# Patient Record
Sex: Male | Born: 1970 | Race: White | Hispanic: No | Marital: Married | Smoking: Never smoker
Health system: Southern US, Community
[De-identification: ages and names within clinical notes are randomized; demographics above are authoritative.]

## PROBLEM LIST (undated history)

## (undated) DIAGNOSIS — E785 Hyperlipidemia, unspecified: Secondary | ICD-10-CM

## (undated) HISTORY — PX: VASECTOMY: SHX75

## (undated) HISTORY — PX: WISDOM TOOTH EXTRACTION: SHX21

## (undated) HISTORY — DX: Hyperlipidemia, unspecified: E78.5

---

## 2003-07-27 ENCOUNTER — Ambulatory Visit (HOSPITAL_COMMUNITY): Admission: RE | Admit: 2003-07-27 | Discharge: 2003-07-27 | Payer: Self-pay | Admitting: Diagnostic Radiology

## 2008-03-11 ENCOUNTER — Ambulatory Visit (HOSPITAL_BASED_OUTPATIENT_CLINIC_OR_DEPARTMENT_OTHER): Admission: RE | Admit: 2008-03-11 | Discharge: 2008-03-11 | Payer: Self-pay | Admitting: Orthopedic Surgery

## 2014-03-22 ENCOUNTER — Other Ambulatory Visit (HOSPITAL_COMMUNITY): Payer: Self-pay | Admitting: Unknown Physician Specialty

## 2014-03-22 DIAGNOSIS — G459 Transient cerebral ischemic attack, unspecified: Secondary | ICD-10-CM

## 2016-05-28 DIAGNOSIS — E785 Hyperlipidemia, unspecified: Secondary | ICD-10-CM | POA: Diagnosis not present

## 2016-08-21 DIAGNOSIS — D3131 Benign neoplasm of right choroid: Secondary | ICD-10-CM | POA: Diagnosis not present

## 2017-03-21 DIAGNOSIS — R220 Localized swelling, mass and lump, head: Secondary | ICD-10-CM | POA: Diagnosis not present

## 2017-05-15 ENCOUNTER — Other Ambulatory Visit: Payer: Self-pay | Admitting: Internal Medicine

## 2017-05-15 DIAGNOSIS — E785 Hyperlipidemia, unspecified: Secondary | ICD-10-CM

## 2017-06-04 ENCOUNTER — Ambulatory Visit
Admission: RE | Admit: 2017-06-04 | Discharge: 2017-06-04 | Disposition: A | Discharge status: Home / Self Care | Payer: No Typology Code available for payment source | Source: Ambulatory Visit | Attending: Internal Medicine | Admitting: Internal Medicine

## 2017-06-04 DIAGNOSIS — E785 Hyperlipidemia, unspecified: Secondary | ICD-10-CM

## 2017-07-23 DIAGNOSIS — J3089 Other allergic rhinitis: Secondary | ICD-10-CM | POA: Diagnosis not present

## 2017-07-23 DIAGNOSIS — Z91010 Allergy to peanuts: Secondary | ICD-10-CM | POA: Diagnosis not present

## 2017-07-23 DIAGNOSIS — Z91013 Allergy to seafood: Secondary | ICD-10-CM | POA: Diagnosis not present

## 2017-07-23 DIAGNOSIS — T781XXA Other adverse food reactions, not elsewhere classified, initial encounter: Secondary | ICD-10-CM | POA: Diagnosis not present

## 2017-10-15 DIAGNOSIS — D3131 Benign neoplasm of right choroid: Secondary | ICD-10-CM | POA: Diagnosis not present

## 2017-12-26 DIAGNOSIS — Z23 Encounter for immunization: Secondary | ICD-10-CM | POA: Diagnosis not present

## 2018-12-24 DIAGNOSIS — Z23 Encounter for immunization: Secondary | ICD-10-CM | POA: Diagnosis not present

## 2019-02-17 DIAGNOSIS — Z20828 Contact with and (suspected) exposure to other viral communicable diseases: Secondary | ICD-10-CM | POA: Diagnosis not present

## 2019-07-16 DIAGNOSIS — E7849 Other hyperlipidemia: Secondary | ICD-10-CM | POA: Diagnosis not present

## 2019-07-16 DIAGNOSIS — Z0000 Encounter for general adult medical examination without abnormal findings: Secondary | ICD-10-CM | POA: Diagnosis not present

## 2019-07-16 DIAGNOSIS — Z125 Encounter for screening for malignant neoplasm of prostate: Secondary | ICD-10-CM | POA: Diagnosis not present

## 2019-11-10 IMAGING — CT CT HEART SCORING
3 series · 13 of 20 positions shown, 15 images · non-contrast
Comparison: None.

CLINICAL DATA: 46-year-old male with history of elevated
cholesterol.

EXAM:
CT HEART FOR CALCIUM SCORING
TECHNIQUE: CT heart was performed on a 64 channel system using prospective ECG
gating.
A non-contrast exam for calcium scoring was performed.
Note that this exam targets the heart and the chest was not imaged
in its entirety.

[Series 2: calcium scoring 2.00 qr36 bestdiast 70% · axial · 0.35mm/px · z∈[+1565,+1637]mm · 3 of 90 slices shown]
[im 18/90  vessel]
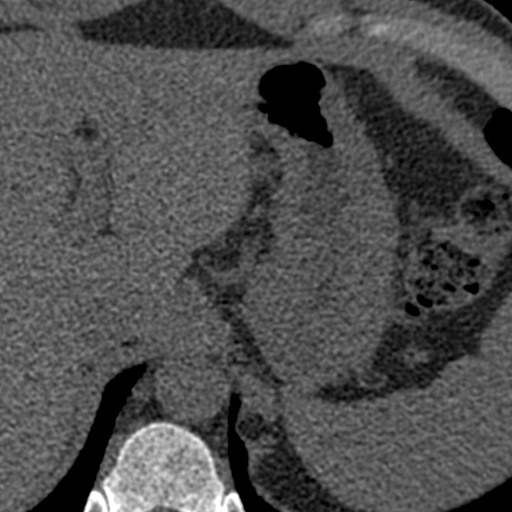
[im 36/90  vessel]
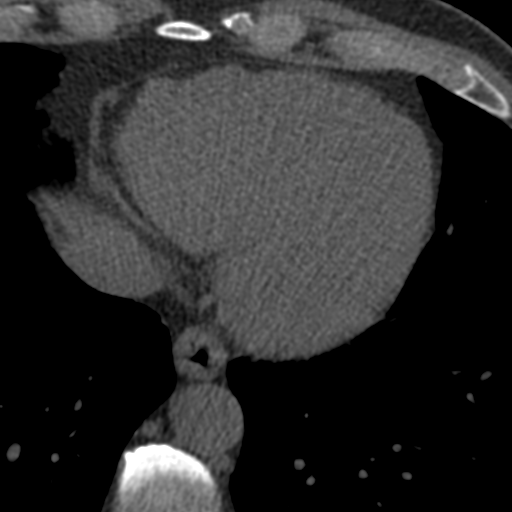
[im 54/90  vessel]
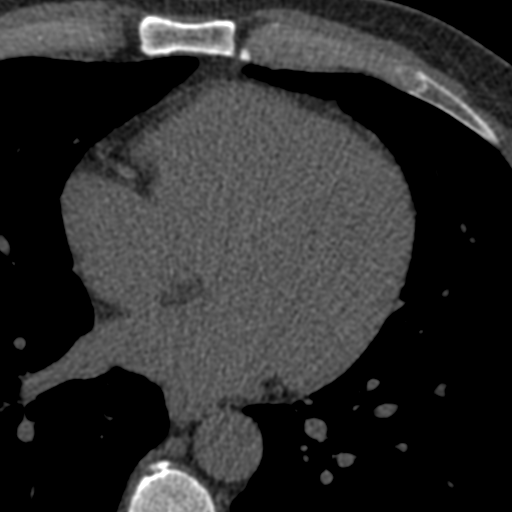

[Series 3: calcium scoring 2.00 br40 bestdiast 70% ax fov · axial · 0.52mm/px · z∈[+1559,+1679]mm · 5 of 90 slices shown, 7 images]
[im 15/90  vessel]
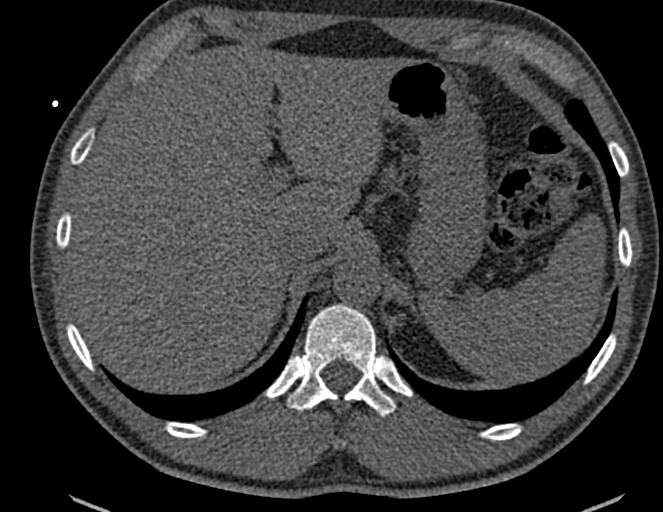
[im 15/90  lung]
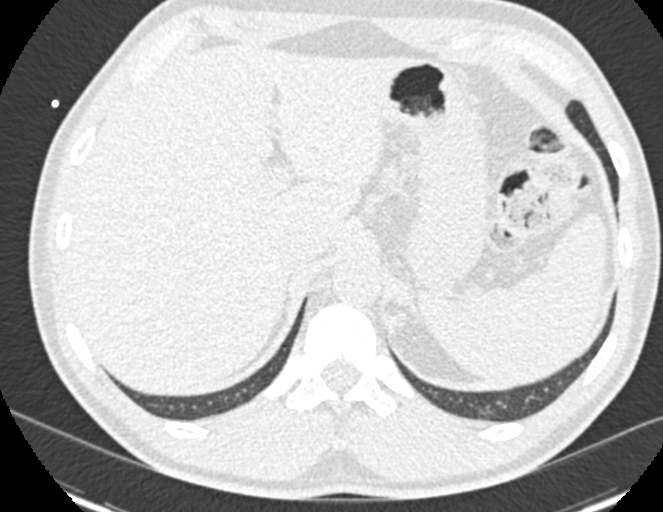
[im 30/90  vessel]
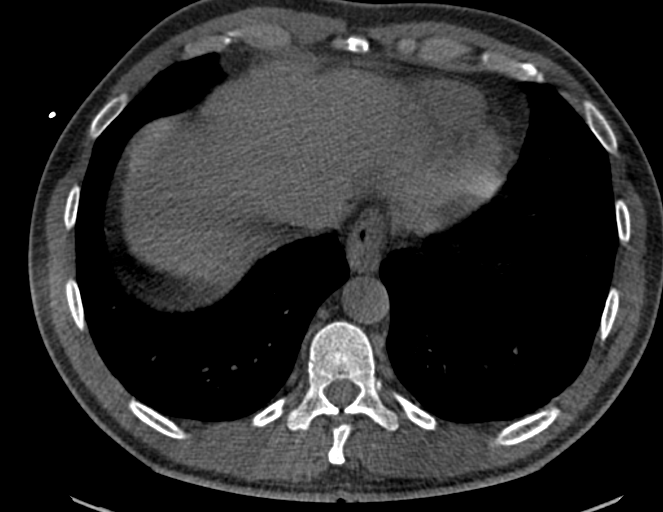
[im 45/90  vessel]
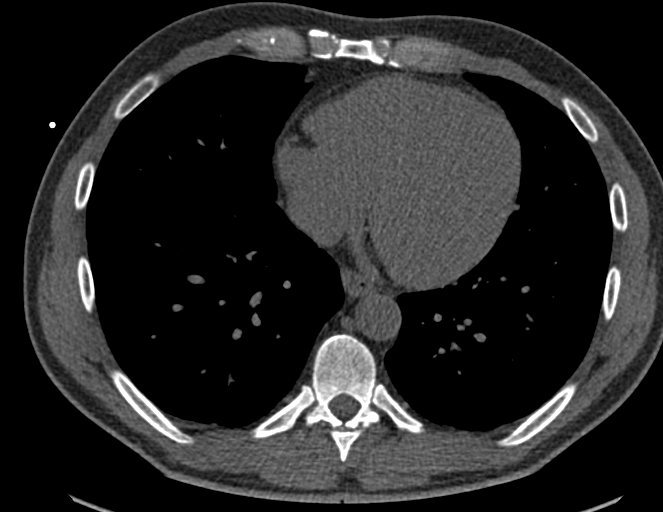
[im 60/90  vessel]
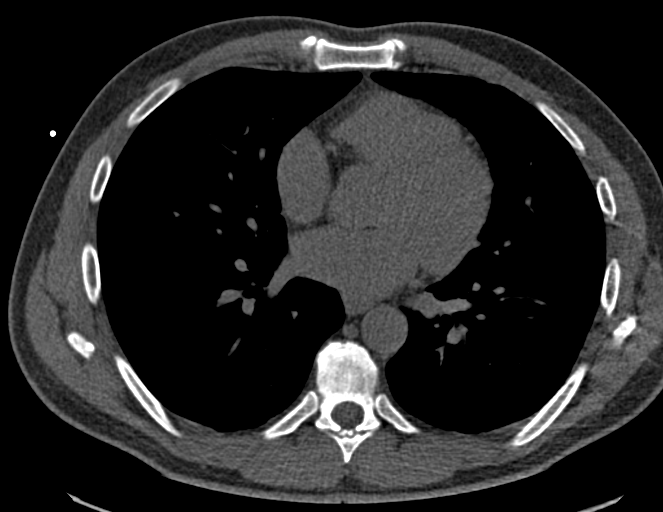
[im 75/90  vessel]
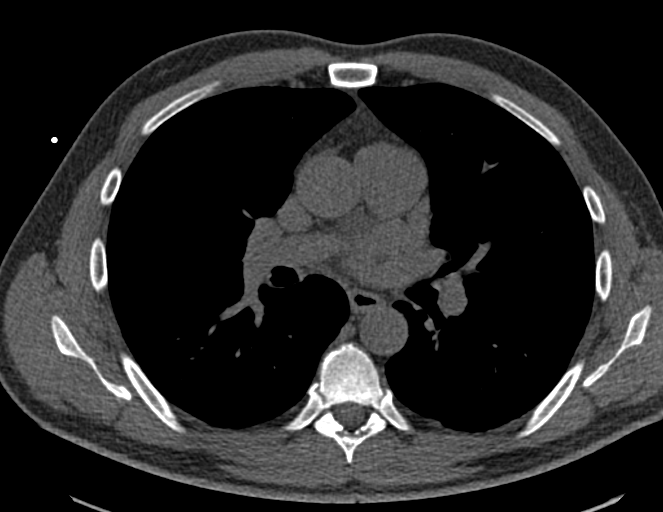
[im 75/90  lung]
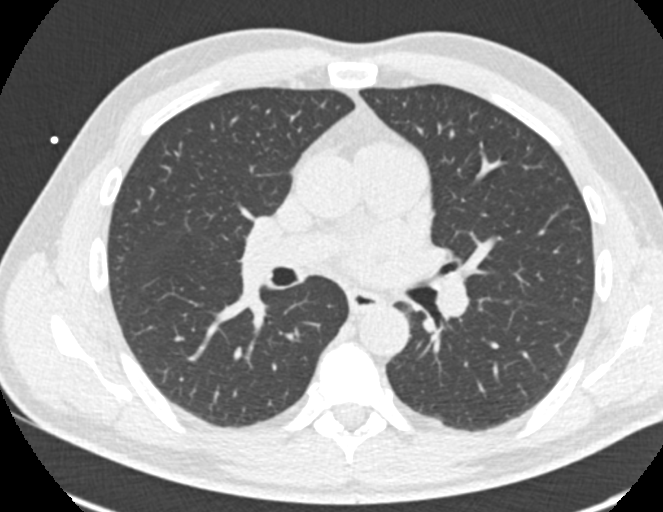

[Series 9: calcium scoring 2.00 br60 bestdiast 70% ax fov · axial · 0.52mm/px · z∈[+1559,+1679]mm · 5 of 90 slices shown]
[im 15/90  vessel]
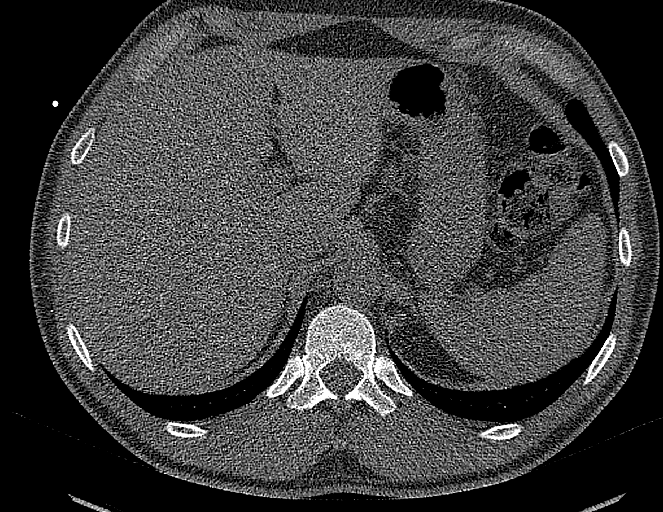
[im 30/90  vessel]
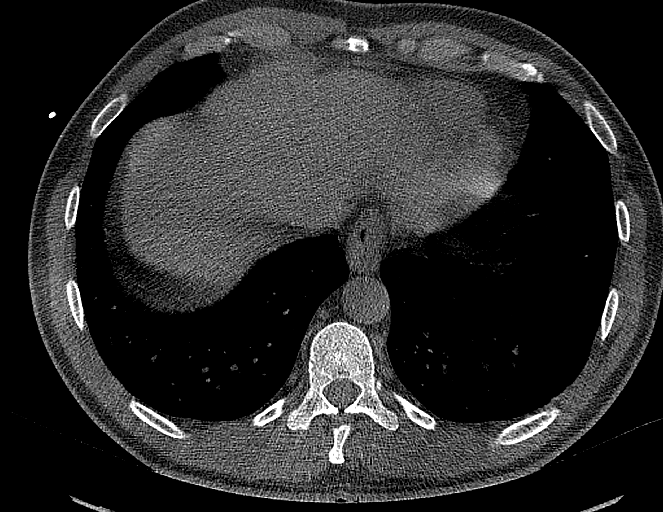
[im 45/90  vessel]
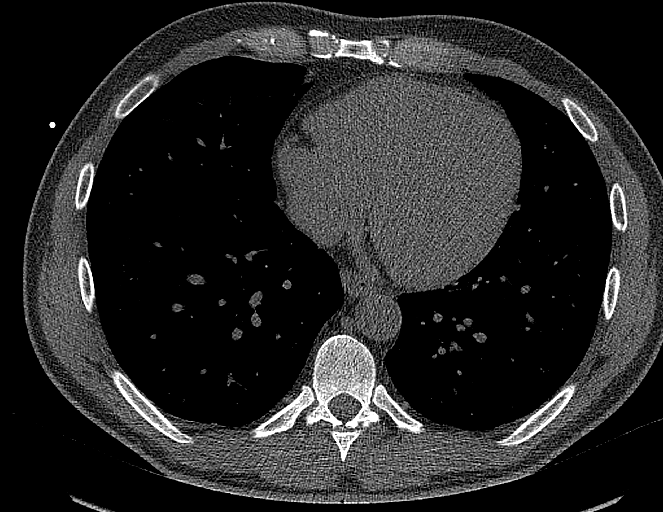
[im 60/90  vessel]
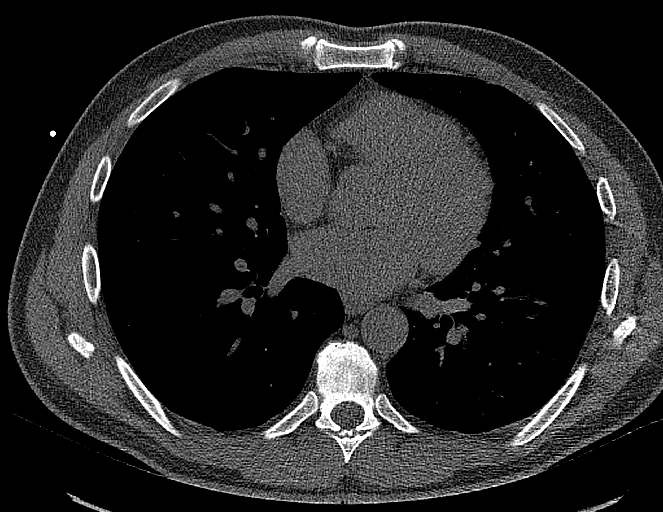
[im 75/90  vessel]
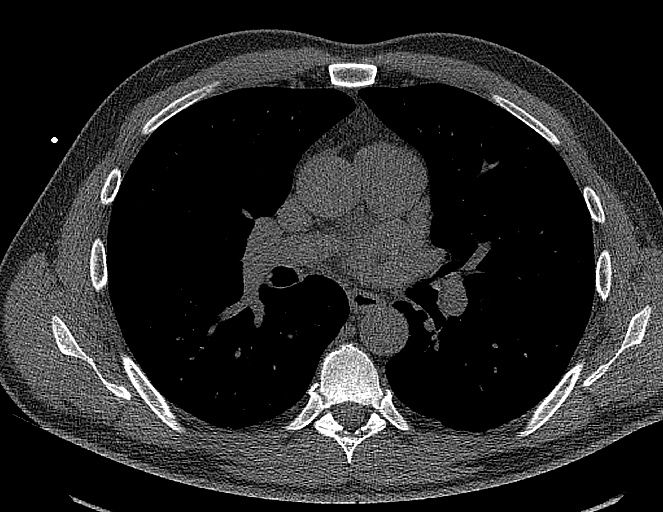

[13 of 20 positions shown; findings below may reference images not displayed]

FINDINGS: Technical quality: Good.

CORONARY CALCIUM

Total Agatston Score: 20

[HOSPITAL] percentile:  84th

OTHER FINDINGS:

Aortic atherosclerosis (mild). 4 mm subpleural nodule in the right
middle lobe associated with the minor fissure (axial image 22 of
series 9), nonspecific, but statistically likely to represent a
benign subpleural lymph node. Within the visualized portions of the
thorax there are no other larger more suspicious appearing pulmonary
nodules or masses, there is no acute consolidative airspace disease,
no pleural effusions, no pneumothorax and no lymphadenopathy.
Visualized portions of the upper abdomen are unremarkable. There are
no aggressive appearing lytic or blastic lesions noted in the
visualized portions of the skeleton.
IMPRESSION: 1. Patient's total coronary artery calcium score is 20 which is
eighty-fourth percentile for patient's of matched age, gender and
race/ethnicity. Please note that although the presence of coronary
artery calcium documents the presence of coronary artery disease,
the severity of this disease and any potential stenosis cannot be
assessed on this noncontrast CT examination. Assessment for
potential risk factor modification, dietary therapy or pharmacologic
therapy may be warranted, if clinically indicated.
2. 4 mm subpleural nodule in the right middle lobe. This is
nonspecific, but statistically likely benign. No follow-up needed if
patient is low-risk. Non-contrast chest CT can be considered in 12
months if patient is high-risk. This recommendation follows the
consensus statement: Guidelines for Management of Incidental
Pulmonary Nodules Detected on CT Images: From the [HOSPITAL]
3.  Aortic Atherosclerosis ([id number]-QHY.Y).

## 2020-11-14 ENCOUNTER — Telehealth: Payer: Self-pay

## 2020-11-14 NOTE — Telephone Encounter (Signed)
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 Pre-visit on 01/24/21 at 11:00am. I confirmed address and we will mail him appointment information and paperwork needed.

## 2020-11-15 ENCOUNTER — Encounter: Payer: Self-pay | Admitting: Internal Medicine

## 2020-11-15 NOTE — Telephone Encounter (Signed)
Paperwork was mailed out today with appointment details.

## 2020-12-13 DIAGNOSIS — Z23 Encounter for immunization: Secondary | ICD-10-CM | POA: Diagnosis not present

## 2021-01-24 ENCOUNTER — Other Ambulatory Visit: Payer: Self-pay

## 2021-01-24 ENCOUNTER — Ambulatory Visit (AMBULATORY_SURGERY_CENTER): Payer: Self-pay | Admitting: *Deleted

## 2021-01-24 ENCOUNTER — Encounter: Payer: Self-pay | Admitting: Internal Medicine

## 2021-01-24 VITALS — Ht 65.0 in | Wt 172.0 lb

## 2021-01-24 DIAGNOSIS — Z1211 Encounter for screening for malignant neoplasm of colon: Secondary | ICD-10-CM

## 2021-01-24 NOTE — Progress Notes (Signed)

## 2021-02-09 ENCOUNTER — Ambulatory Visit (AMBULATORY_SURGERY_CENTER): Payer: BC Managed Care – PPO | Admitting: Internal Medicine

## 2021-02-09 ENCOUNTER — Encounter: Payer: Self-pay | Admitting: Internal Medicine

## 2021-02-09 ENCOUNTER — Other Ambulatory Visit: Payer: Self-pay

## 2021-02-09 VITALS — BP 112/67 | HR 56 | Temp 97.8°F | Resp 11 | Ht 65.0 in | Wt 172.0 lb

## 2021-02-09 DIAGNOSIS — K5140 Inflammatory polyps of colon without complications: Secondary | ICD-10-CM | POA: Diagnosis not present

## 2021-02-09 DIAGNOSIS — Z1211 Encounter for screening for malignant neoplasm of colon: Secondary | ICD-10-CM

## 2021-02-09 DIAGNOSIS — D120 Benign neoplasm of cecum: Secondary | ICD-10-CM | POA: Diagnosis not present

## 2021-02-09 MED ORDER — SODIUM CHLORIDE 0.9 % IV SOLN: 500.0000 mL | Freq: Once | INTRAVENOUS | Status: DC

## 2021-02-09 NOTE — Patient Instructions (Addendum)
I found and removed one 2 mm polyp.  All else normal.  I will let you know pathology results and when to have another routine colonoscopy by mail and/or My Chart.  I appreciate the opportunity to care for you. Gatha Mayer, MD, [facility]   Handout given on polyps.  YOU HAD AN ENDOSCOPIC PROCEDURE TODAY AT [facility]:   Refer to the procedure report that was given to you for any specific questions about what was found during the examination.  If the procedure report does not answer your questions, please call your gastroenterologist to clarify.  If you requested that your care partner not be given the details of your procedure findings, then the procedure report has been included in a sealed envelope for you to review at your convenience later.  YOU SHOULD EXPECT: Some feelings of bloating in the abdomen. Passage of more gas than usual.  Walking can help get rid of the air that was put into your GI tract during the procedure and reduce the bloating. If you had a lower endoscopy (such as a colonoscopy or flexible sigmoidoscopy) you may notice spotting of blood in your stool or on the toilet paper. If you underwent a bowel prep for your procedure, you may not have a normal bowel movement for a few days.  Please Note:  You might notice some irritation and congestion in your nose or some drainage.  This is from the oxygen used during your procedure.  There is no need for concern and it should clear up in a day or so.  SYMPTOMS TO REPORT IMMEDIATELY:  Following lower endoscopy (colonoscopy or flexible sigmoidoscopy):  Excessive amounts of blood in the stool  Significant tenderness or worsening of abdominal pains  Swelling of the abdomen that is new, acute  Fever of 100F or higher  For urgent or emergent issues, a gastroenterologist can be reached at any hour by calling [phone]. Do not use MyChart messaging for urgent concerns.    DIET:  We do recommend a small meal at  first, but then you may proceed to your regular diet.  Drink plenty of fluids but you should avoid alcoholic beverages for 24 hours.  ACTIVITY:  You should plan to take it easy for the rest of today and you should NOT DRIVE or use heavy machinery until tomorrow (because of the sedation medicines used during the test).    FOLLOW UP: Our staff will call the number listed on your records 48-72 hours following your procedure to check on you and address any questions or concerns that you may have regarding the information given to you following your procedure. If we do not reach you, we will leave a message.  We will attempt to reach you two times.  During this call, we will ask if you have developed any symptoms of COVID 19. If you develop any symptoms (ie: fever, flu-like symptoms, shortness of breath, cough etc.) before then, please call [phone].  If you test positive for Covid 19 in the 2 weeks post procedure, please call and report this information to [place].    If any biopsies were taken you will be contacted by phone or by letter within the next 1-3 weeks.  Please call us at [phone] if you have not heard about the biopsies in 3 weeks.    SIGNATURES/CONFIDENTIALITY: You and/or your care partner have signed paperwork which will be entered into your electronic medical record.  These signatures attest to the  fact that that the information above on your After Visit Summary has been reviewed and is understood.  Full responsibility of the confidentiality of this discharge information lies with you and/or your care-partner.

## 2021-02-09 NOTE — Progress Notes (Signed)
Called to room to assist during endoscopic procedure.  Patient ID and intended procedure confirmed with present staff. Received instructions for my participation in the procedure from the performing physician.  

## 2021-02-09 NOTE — Op Note (Signed)
[place] Patient Name: Samuel Hayes Procedure Date: 02/09/2021 1:23 PM MRN: [id number] Endoscopist: Gatha Mayer , MD Age: 50 Referring MD:  Date of Birth: Mar 24, 1971 Gender: Male Account #: [id number] Procedure:                Colonoscopy Indications:              Screening for colorectal malignant neoplasm, This                            is the patient's first colonoscopy Medicines:                Propofol per Anesthesia, Monitored Anesthesia Care Procedure:                Pre-Anesthesia Assessment:                           - Prior to the procedure, a History and Physical                            was performed, and patient medications and                            allergies were reviewed. The patient's tolerance of                            previous anesthesia was also reviewed. The risks                            and benefits of the procedure and the sedation                            options and risks were discussed with the patient.                            All questions were answered, and informed consent                            was obtained. Prior Anticoagulants: The patient has                            taken no previous anticoagulant or antiplatelet                            agents. ASA Grade Assessment: I - A normal, healthy                            patient. After reviewing the risks and benefits,                            the patient was deemed in satisfactory condition to                            undergo the procedure.  After obtaining informed consent, the colonoscope                            was passed under direct vision. Throughout the                            procedure, the patient's blood pressure, pulse, and                            oxygen saturations were monitored continuously. The                            #[id number] [place] was introduced through                            the anus and  advanced to the the cecum, identified                            by appendiceal orifice and ileocecal valve. The                            colonoscopy was performed without difficulty. The                            patient tolerated the procedure well. The quality                            of the bowel preparation was good. The bowel                            preparation used was Miralax via split dose                            instruction. The ileocecal valve, appendiceal                            orifice, and rectum were photographed. Scope In: 1:39:43 PM Scope Out: 1:55:42 PM Scope Withdrawal Time: 0 hours 13 minutes 19 seconds  Total Procedure Duration: 0 hours 15 minutes 59 seconds  Findings:                 The perianal and digital rectal examinations were                            normal. Pertinent negatives include normal prostate                            (size, shape, and consistency).                           A 2 mm polyp was found in the cecum. The polyp was                            semi-pedunculated. The polyp was removed with a  cold snare. Resection and retrieval were complete.                            Verification of patient identification for the                            specimen was done. Estimated blood loss was minimal.                           The exam was otherwise without abnormality on                            direct and retroflexion views. Complications:            No immediate complications. Estimated Blood Loss:     Estimated blood loss was minimal. Impression:               - One 2 mm polyp in the cecum, removed with a cold                            snare. Resected and retrieved.                           - The examination was otherwise normal on direct                            and retroflexion views. Recommendation:           - Patient has a contact number available for                            emergencies. The  signs and symptoms of potential                            delayed complications were discussed with the                            patient. Return to normal activities tomorrow.                            Written discharge instructions were provided to the                            patient.                           - Resume previous diet.                           - Continue present medications.                           - Repeat colonoscopy is recommended. The                            colonoscopy date will be determined after pathology  results from today's exam become available for                            review. Gatha Mayer, MD 02/09/2021 2:02:19 PM This report has been signed electronically.

## 2021-02-09 NOTE — Progress Notes (Signed)
Pt's states no medical or surgical changes since previsit or office visit. 

## 2021-02-09 NOTE — Progress Notes (Signed)
PT taken to PACU. Monitors in place. VSS. Report given to RN. 

## 2021-02-09 NOTE — Progress Notes (Signed)
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 Authorizing Provider  rosuvastatin (CRESTOR) 20 MG tablet Take 20 mg by mouth daily. 08/07/20  Yes [provider]  EPINEPHrine, Anaphylaxis, 1 MG/ML SOLN Inject as directed.    [provider]    Current Outpatient Medications  Medication Sig Dispense Refill   rosuvastatin (CRESTOR) 20 MG tablet Take 20 mg by mouth daily.     EPINEPHrine, Anaphylaxis, 1 MG/ML SOLN Inject as directed.     Current Facility-Administered Medications  Medication Dose Route Frequency Provider Last Rate Last Admin   0.9 %  sodium chloride infusion  500 mL Intravenous Once Gatha Mayer, MD        Allergies as of 02/09/2021 - Review Complete 02/09/2021  Allergen Reaction Noted   Shrimp (diagnostic) Anaphylaxis 01/24/2021   Other  01/24/2021    Family History  Problem Relation Age of Onset   Colon cancer Paternal Grandmother        dx age 21   Esophageal cancer Neg Hx    Rectal cancer Neg Hx    Stomach cancer Neg Hx     Social History   Socioeconomic History   Marital status: Married    Spouse name: Not on file   Number of children: Not on file   Years of education: Not on file   Highest education level: Not on file  Occupational History   Not on file  Tobacco Use   Smoking status: Never   Smokeless tobacco: Never  Vaping Use   Vaping Use: Never used  Substance and Sexual Activity   Alcohol use: Yes    Alcohol/week: 8.0 standard drinks    Types: 8 Standard drinks or equivalent per  week   Drug use: Not Currently     Review of Systems:  All other review of systems negative except as mentioned in the HPI.  Physical Exam: Vital signs BP 121/77   Pulse 75   Temp 97.8 F (36.6 C)   Ht 5\' 5"  (1.651 m)   Wt 172 lb (78 kg)   SpO2 99%   BMI 28.62 kg/m   General:   Alert,  Well-developed, well-nourished, pleasant and cooperative in NAD Lungs:  Clear throughout to auscultation.   Heart:  Regular rate and rhythm; no murmurs, clicks, rubs,  or gallops. Abdomen:  Soft, nontender and nondistended. Normal bowel sounds.   Neuro/Psych:  Alert and cooperative. Normal mood and affect. A and O x 3   @Samuel Hayes  Simonne Maffucci, MD, [facility] [phone] (pager) 02/09/2021 1:31 PM@

## 2021-02-13 ENCOUNTER — Telehealth: Payer: Self-pay

## 2021-02-13 NOTE — Telephone Encounter (Signed)
  Follow up Call-  Call back number 02/09/2021  Post procedure Call Back phone  # [phone]  Permission to leave phone message Yes  Some recent data might be hidden     Patient questions:  Do you have a fever, pain , or abdominal swelling? No. Pain Score  0 *  Have you tolerated food without any problems? Yes.    Have you been able to return to your normal activities? Yes.    Do you have any questions about your discharge instructions: Diet   No. Medications  No. Follow up visit  No.  Do you have questions or concerns about your Care? No.  Actions: * If pain score is 4 or above: No action needed, pain <4.  Have you developed a fever since your procedure? no  2.   Have you had an respiratory symptoms (SOB or cough) since your procedure? no  3.   Have you tested positive for COVID 19 since your procedure no  4.   Have you had any family members/close contacts diagnosed with the COVID 19 since your procedure?  no   If yes to any of these questions please route to Joylene Mc, RN and Joella Prince, RN

## 2021-02-18 ENCOUNTER — Encounter: Payer: Self-pay | Admitting: Internal Medicine

## 2021-12-24 DIAGNOSIS — Z23 Encounter for immunization: Secondary | ICD-10-CM | POA: Diagnosis not present

## 2022-12-10 DIAGNOSIS — Z23 Encounter for immunization: Secondary | ICD-10-CM | POA: Diagnosis not present

## 2024-01-05 DIAGNOSIS — Z23 Encounter for immunization: Secondary | ICD-10-CM | POA: Diagnosis not present
